# Patient Record
Sex: Female | Born: 1935 | Race: White | Hispanic: No | Smoking: Never smoker
Health system: Southern US, Community
[De-identification: ages and names within clinical notes are randomized; demographics above are authoritative.]

## PROBLEM LIST (undated history)

## (undated) DIAGNOSIS — I219 Acute myocardial infarction, unspecified: Secondary | ICD-10-CM

## (undated) DIAGNOSIS — I639 Cerebral infarction, unspecified: Secondary | ICD-10-CM

## (undated) DIAGNOSIS — K219 Gastro-esophageal reflux disease without esophagitis: Secondary | ICD-10-CM

## (undated) HISTORY — PX: CHOLECYSTECTOMY: SHX55

## (undated) HISTORY — PX: TONSILLECTOMY: SUR1361

## (undated) HISTORY — PX: EYE SURGERY: SHX253

---

## 1998-04-06 ENCOUNTER — Ambulatory Visit (HOSPITAL_COMMUNITY): Admission: RE | Admit: 1998-04-06 | Discharge: 1998-04-06 | Payer: Self-pay | Admitting: Family Medicine

## 1998-04-12 ENCOUNTER — Other Ambulatory Visit: Admission: RE | Admit: 1998-04-12 | Discharge: 1998-04-12 | Payer: Self-pay | Admitting: Family Medicine

## 1998-09-12 ENCOUNTER — Other Ambulatory Visit: Admission: RE | Admit: 1998-09-12 | Discharge: 1998-09-12 | Payer: Self-pay | Admitting: Obstetrics and Gynecology

## 1999-06-22 ENCOUNTER — Other Ambulatory Visit: Admission: RE | Admit: 1999-06-22 | Discharge: 1999-06-22 | Payer: Self-pay | Admitting: Obstetrics and Gynecology

## 1999-07-21 ENCOUNTER — Encounter: Admission: RE | Admit: 1999-07-21 | Discharge: 1999-07-21 | Payer: Self-pay | Admitting: Family Medicine

## 1999-09-07 ENCOUNTER — Other Ambulatory Visit: Admission: RE | Admit: 1999-09-07 | Discharge: 1999-09-07 | Payer: Self-pay | Admitting: Orthopaedic Surgery

## 2000-08-13 ENCOUNTER — Other Ambulatory Visit: Admission: RE | Admit: 2000-08-13 | Discharge: 2000-08-13 | Payer: Self-pay | Admitting: Obstetrics and Gynecology

## 2011-10-09 HISTORY — PX: CORONARY ANGIOPLASTY WITH STENT PLACEMENT: SHX49

## 2012-03-21 ENCOUNTER — Encounter (HOSPITAL_BASED_OUTPATIENT_CLINIC_OR_DEPARTMENT_OTHER): Payer: Self-pay | Admitting: *Deleted

## 2012-03-21 ENCOUNTER — Emergency Department (HOSPITAL_BASED_OUTPATIENT_CLINIC_OR_DEPARTMENT_OTHER)
Admission: EM | Admit: 2012-03-21 | Discharge: 2012-03-21 | Disposition: A | Discharge status: Home / Self Care | Payer: Medicare Other | Attending: Emergency Medicine | Admitting: Emergency Medicine

## 2012-03-21 ENCOUNTER — Emergency Department (HOSPITAL_BASED_OUTPATIENT_CLINIC_OR_DEPARTMENT_OTHER): Payer: Medicare Other

## 2012-03-21 DIAGNOSIS — R079 Chest pain, unspecified: Secondary | ICD-10-CM | POA: Insufficient documentation

## 2012-03-21 DIAGNOSIS — I214 Non-ST elevation (NSTEMI) myocardial infarction: Secondary | ICD-10-CM

## 2012-03-21 DIAGNOSIS — K219 Gastro-esophageal reflux disease without esophagitis: Secondary | ICD-10-CM | POA: Insufficient documentation

## 2012-03-21 DIAGNOSIS — M546 Pain in thoracic spine: Secondary | ICD-10-CM | POA: Insufficient documentation

## 2012-03-21 HISTORY — DX: Gastro-esophageal reflux disease without esophagitis: K21.9

## 2012-03-21 LAB — CBC
HCT: 43.3 % (ref 36.0–46.0)
Hemoglobin: 15.7 g/dL — ABNORMAL HIGH (ref 12.0–15.0)
MCV: 90.2 fL (ref 78.0–100.0)
RDW: 12.3 % (ref 11.5–15.5)
WBC: 11.3 10*3/uL — ABNORMAL HIGH (ref 4.0–10.5)

## 2012-03-21 LAB — COMPREHENSIVE METABOLIC PANEL
AST: 27 U/L (ref 0–37)
BUN: 17 mg/dL (ref 6–23)
CO2: 25 mEq/L (ref 19–32)
Calcium: 9.7 mg/dL (ref 8.4–10.5)
Chloride: 101 mEq/L (ref 96–112)
Creatinine, Ser: 1 mg/dL (ref 0.50–1.10)
GFR calc Af Amer: 63 mL/min — ABNORMAL LOW (ref >90)
GFR calc non Af Amer: 54 mL/min — ABNORMAL LOW (ref >90)
Glucose, Bld: 144 mg/dL — ABNORMAL HIGH (ref 70–99)
Total Bilirubin: 0.3 mg/dL (ref 0.3–1.2)

## 2012-03-21 LAB — CK TOTAL AND CKMB (NOT AT ARMC)
CK, MB: 5.7 ng/mL — ABNORMAL HIGH (ref 0.3–4.0)
Relative Index: INVALID (ref 0.0–2.5)
Total CK: 41 U/L (ref 7–177)

## 2012-03-21 LAB — DIFFERENTIAL
Basophils Absolute: 0 10*3/uL (ref 0.0–0.1)
Eosinophils Relative: 0 % (ref 0–5)
Lymphocytes Relative: 33 % (ref 12–46)
Lymphs Abs: 3.8 10*3/uL (ref 0.7–4.0)
Monocytes Absolute: 0.8 10*3/uL (ref 0.1–1.0)
Monocytes Relative: 7 % (ref 3–12)
Neutro Abs: 6.8 10*3/uL (ref 1.7–7.7)

## 2012-03-21 MED ORDER — HEPARIN (PORCINE) IN NACL 100-0.45 UNIT/ML-% IJ SOLN
12.0000 [IU]/kg/h | Freq: Once | INTRAMUSCULAR | Status: AC
  Administered 2012-03-21: 12 [IU]/kg/h via INTRAVENOUS
  Filled 2012-03-21: qty 250

## 2012-03-21 MED ORDER — HEPARIN SODIUM (PORCINE) 1000 UNIT/ML IJ SOLN
4000.0000 [IU] | Freq: Once | INTRAMUSCULAR | Status: AC
  Administered 2012-03-21: 4000 [IU] via INTRAVENOUS
  Filled 2012-03-21: qty 4

## 2012-03-21 MED ORDER — SODIUM CHLORIDE 0.9 % IV SOLN
Freq: Once | INTRAVENOUS | Status: AC
  Administered 2012-03-21: 20 mL/h via INTRAVENOUS

## 2012-03-21 MED ORDER — ACETAMINOPHEN 325 MG PO TABS
650.0000 mg | ORAL_TABLET | ORAL | Status: AC
  Administered 2012-03-21: 650 mg via ORAL
  Filled 2012-03-21: qty 2

## 2012-03-21 MED ORDER — GI COCKTAIL ~~LOC~~
30.0000 mL | Freq: Once | ORAL | Status: AC
  Administered 2012-03-21: 30 mL via ORAL
  Filled 2012-03-21: qty 30

## 2012-03-21 NOTE — ED Notes (Signed)
Beeped Dr. Reuel Boom direct for Dr. Radford Pax

## 2012-03-21 NOTE — ED Provider Notes (Signed)
I saw and evaluated the patient, reviewed the resident's note and I agree with the findings and plan.   .Face to face Exam:  General:  Awake HEENT:  Atraumatic Resp:  Normal effort Abd:  Nondistended Neuro:No focal weakness Lymph: No adenopathy   CRITICAL CARE Performed by: Nelva Nay L   Total critical care time: 30 min  Critical care time was exclusive of separately billable procedures and treating other patients.  Critical care was necessary to treat or prevent imminent or life-threatening deterioration.  Critical care was time spent personally by me on the following activities: development of treatment plan with patient and/or surrogate as well as nursing, discussions with consultants, evaluation of patient's response to treatment, examination of patient, obtaining history from patient or surrogate, ordering and performing treatments and interventions, ordering and review of laboratory studies, ordering and review of radiographic studies, pulse oximetry and re-evaluation of patient's condition.   Nelia Shi, MD 03/21/12 [phone]

## 2012-03-21 NOTE — ED Notes (Signed)
Called for disk  for tranfer.

## 2012-03-21 NOTE — ED Notes (Signed)
Pt c/o upper back pain and fullness in chest x 2 days, denies SOB

## 2012-03-21 NOTE — ED Provider Notes (Signed)
History     CSN: [id number]  Arrival date & time 03/21/12  1431   First MD Initiated Contact with Patient 03/21/12 1504      Chief Complaint  Patient presents with  . Chest Pain    (Consider location/radiation/quality/duration/timing/severity/associated sxs/prior treatment) HPI Comments: 76 yo female with a history of GERD presents with pain between the shoulder blades and indigestion since last evening. She did some yard work with hedge clippers-holding arms extended-yesterday afternoon. Later that evening while she was in the house, she noted some sharp pain in thoracic back radiating from right to left shoulder blade. She took some aspirin and it improved, then ate dinner and the pain worsened. Took 2 more baby aspirin and it improved, but notes some indigestion, fullness in her epigastrium and belching. This improved before bedtime and she slept well. Then after breakfast this morning her symptoms returned again, then resolved. Again after lunch today they returned and seemed to worsen. She took 4 baby aspirin again. On her drive over to the ED, while holding her arms outstretched on the steering wheel she felt like her back "locked up" and couldn't move. Now the pain is minimal. Does not seem to be related to exertion.   Denies any dyspnea, cough, fever, falls, trauma, chest pain, palpitations, emesis, nausea, diaphoresis, leg edema.  Patient is a 76 y.o. female presenting with chest pain.  Chest Pain Pertinent negatives for primary symptoms include no fever, no shortness of breath, no cough and no palpitations.     Past Medical History  Diagnosis Date  . GERD (gastroesophageal reflux disease)     Past Surgical History  Procedure Date  . Cholecystectomy   . Tonsillectomy     History reviewed. No pertinent family history.  History  Substance Use Topics  . Smoking status: Never Smoker   . Smokeless tobacco: Not on file  . Alcohol Use: No    OB History    Grav Para  Term Preterm Abortions TAB SAB Ect Mult Living                  Review of Systems  Constitutional: Negative for fever and appetite change.  HENT: Negative for neck pain.   Respiratory: Negative for cough and shortness of breath.   Cardiovascular: Positive for chest pain. Negative for palpitations.  Genitourinary: Negative for dysuria.  Musculoskeletal: Positive for back pain.  Neurological: Negative for syncope and light-headedness.  All other systems reviewed and are negative.    Allergies  Amoxil  Home Medications   Current Outpatient Rx  Name Route Sig Dispense Refill  . OMEPRAZOLE 40 MG PO CPDR Oral Take 40 mg by mouth daily.      BP 146/98  Pulse 90  Temp 97.9 F (36.6 C) (Oral)  Resp 16  Ht 5\' 6"  (1.676 m)  Wt 200 lb (90.719 kg)  BMI 32.28 kg/m2  SpO2 100%  Physical Exam  Vitals reviewed. Constitutional: She is oriented to person, place, and time. She appears well-developed and well-nourished. No distress.  HENT:  Head: Normocephalic and atraumatic.  Mouth/Throat: Oropharynx is clear and moist.  Eyes: EOM are normal. Pupils are equal, round, and reactive to light.  Neck: Normal range of motion. Neck supple.  Cardiovascular: Normal rate, regular rhythm, normal heart sounds and intact distal pulses.  Exam reveals no gallop.   No murmur heard. Pulmonary/Chest: Effort normal and breath sounds normal. No respiratory distress. She has no wheezes. She has no rales. She exhibits no tenderness.  Abdominal: Soft. Bowel sounds are normal. She exhibits no distension. There is no tenderness. There is no rebound and no guarding.  Musculoskeletal: She exhibits no edema.       TTP in right thoracic paraspinal musculature.   Neurological: She is alert and oriented to person, place, and time. She exhibits normal muscle tone. Coordination normal.  Skin: No rash noted. She is not diaphoretic.  Psychiatric: She has a normal mood and affect.    Date: 03/21/2012  Rate: 95   Rhythm: normal sinus rhythm  QRS Axis: normal  Intervals: normal  ST/T Wave abnormalities: normal  Conduction Disutrbances:none  Narrative Interpretation:   Old EKG Reviewed: none available; QW in V2    ED Course  Procedures (including critical care time)  Labs Reviewed  CBC - Abnormal; Notable for the following:    WBC 11.3 (*)     Hemoglobin 15.7 (*)     MCHC 36.3 (*)     All other components within normal limits  COMPREHENSIVE METABOLIC PANEL - Abnormal; Notable for the following:    Potassium 3.4 (*)     Glucose, Bld 144 (*)     GFR calc non Af Amer 54 (*)     GFR calc Af Amer 63 (*)     All other components within normal limits  CK TOTAL AND CKMB - Abnormal; Notable for the following:    CK, MB 5.7 (*)     All other components within normal limits  TROPONIN I - Abnormal; Notable for the following:    Troponin I 1.11 (*)     All other components within normal limits  DIFFERENTIAL   Lab Results  Component Value Date   CREATININE 1.00 03/21/2012   Lab Results  Component Value Date   NA 137 03/21/2012   K 3.4* 03/21/2012   CL 101 03/21/2012   CO2 25 03/21/2012     Dg Chest 2 View  03/21/2012  *RADIOLOGY REPORT*  Clinical Data: Chest pain, pain between shoulder blades  CHEST - 2 VIEW  Comparison: None  Findings: Borderline enlargement of cardiac silhouette. Tortuous aorta. Pulmonary vascularity normal. Lungs clear. No pleural effusion or pneumothorax. No acute osseous findings.  IMPRESSION: Borderline enlargement of cardiac silhouette.  Original Report Authenticated By: Lollie Marrow, M.D.     1. NSTEMI (non-ST elevated myocardial infarction)      MDM  76 yo female with history of GERD presents with back pain and indigestion. No obvious EKG changes, + troponin 1.11, c/w NSTEMI. No active pain. Will start heparin drip and admit to [facility], case d/w Cardiology, Dr. Reuel Boom.       Durwin Reges, MD 03/21/12 [phone]

## 2017-01-13 ENCOUNTER — Emergency Department (HOSPITAL_BASED_OUTPATIENT_CLINIC_OR_DEPARTMENT_OTHER)
Admission: EM | Admit: 2017-01-13 | Discharge: 2017-01-13 | Disposition: A | Discharge status: Home / Self Care | Payer: Medicare Other | Attending: Emergency Medicine | Admitting: Emergency Medicine

## 2017-01-13 ENCOUNTER — Emergency Department (HOSPITAL_BASED_OUTPATIENT_CLINIC_OR_DEPARTMENT_OTHER): Payer: Medicare Other

## 2017-01-13 ENCOUNTER — Encounter (HOSPITAL_BASED_OUTPATIENT_CLINIC_OR_DEPARTMENT_OTHER): Payer: Self-pay | Admitting: Emergency Medicine

## 2017-01-13 DIAGNOSIS — Z7982 Long term (current) use of aspirin: Secondary | ICD-10-CM | POA: Insufficient documentation

## 2017-01-13 DIAGNOSIS — M546 Pain in thoracic spine: Secondary | ICD-10-CM | POA: Insufficient documentation

## 2017-01-13 DIAGNOSIS — Z955 Presence of coronary angioplasty implant and graft: Secondary | ICD-10-CM | POA: Diagnosis not present

## 2017-01-13 DIAGNOSIS — Z79899 Other long term (current) drug therapy: Secondary | ICD-10-CM | POA: Diagnosis not present

## 2017-01-13 HISTORY — DX: Acute myocardial infarction, unspecified: I21.9

## 2017-01-13 LAB — CBC WITH DIFFERENTIAL/PLATELET
Basophils Absolute: 0 10*3/uL (ref 0.0–0.1)
Basophils Relative: 0 %
Eosinophils Absolute: 0 10*3/uL (ref 0.0–0.7)
Eosinophils Relative: 0 %
HCT: 36.7 % (ref 36.0–46.0)
Hemoglobin: 12.8 g/dL (ref 12.0–15.0)
Lymphocytes Relative: 26 %
Lymphs Abs: 2.2 10*3/uL (ref 0.7–4.0)
MCH: 31.9 pg (ref 26.0–34.0)
MCHC: 34.9 g/dL (ref 30.0–36.0)
MCV: 91.5 fL (ref 78.0–100.0)
Monocytes Absolute: 0.8 10*3/uL (ref 0.1–1.0)
Monocytes Relative: 9 %
Neutro Abs: 5.4 10*3/uL (ref 1.7–7.7)
Neutrophils Relative %: 65 %
Platelets: 216 10*3/uL (ref 150–400)
RBC: 4.01 MIL/uL (ref 3.87–5.11)
RDW: 12.1 % (ref 11.5–15.5)
WBC: 8.3 10*3/uL (ref 4.0–10.5)

## 2017-01-13 LAB — BASIC METABOLIC PANEL
Anion gap: 8 (ref 5–15)
BUN: 21 mg/dL — ABNORMAL HIGH (ref 6–20)
CO2: 25 mmol/L (ref 22–32)
Calcium: 9.2 mg/dL (ref 8.9–10.3)
Chloride: 104 mmol/L (ref 101–111)
Creatinine, Ser: 1.02 mg/dL — ABNORMAL HIGH (ref 0.44–1.00)
GFR calc Af Amer: 59 mL/min — ABNORMAL LOW (ref >60)
GFR calc non Af Amer: 51 mL/min — ABNORMAL LOW (ref >60)
Glucose, Bld: 107 mg/dL — ABNORMAL HIGH (ref 65–99)
Potassium: 3.8 mmol/L (ref 3.5–5.1)
Sodium: 137 mmol/L (ref 135–145)

## 2017-01-13 LAB — TROPONIN I: Troponin I: <0.03 ng/mL (ref <0.03)

## 2017-01-13 NOTE — ED Triage Notes (Signed)
Pt with back pain in the thoracic area since Friday. Denies radiation or recent injury. NAD.   Pt does have hx of MI 5 years ago. EKG to be done in the room.

## 2017-01-13 NOTE — ED Provider Notes (Signed)
[facility] Provider Note   CSN: [id number] Arrival date & time: 01/13/17  1329    By signing my name below, I, Freida Busman, attest that this documentation has been prepared under the direction and in the presence of Raeford Razor, MD . Electronically Signed: Freida Busman, Scribe. 01/13/2017. 2:02 PM. History   Chief Complaint Chief Complaint  Patient presents with  . Back Pain    The history is provided by the patient. No language interpreter was used.    HPI Comments:  Patricia Case is a 81 y.o. female who presents to the Emergency Department complaining of intermittent mid back pain x 3 days. She describes her pain as a dull ache. Pt has a h/o MI in June 2013 requiring coronary stents; notes back pain at that time but states her pain was sharp. She has applied a heat pain with mild relief. Pt has also taken baby ASA.  She denies exacerbation of pain with movement. No SOB, DOE, nausea, or diaphoresis.   [facility]   Past Medical History:  Diagnosis Date  . Acute MI    HX of 2 MI's last one 2013  . GERD (gastroesophageal reflux disease)     There are no active problems to display for this patient.   Past Surgical History:  Procedure Laterality Date  . CHOLECYSTECTOMY    . CORONARY ANGIOPLASTY WITH STENT PLACEMENT  2013  . EYE SURGERY    . TONSILLECTOMY      OB History    No data available       Home Medications    Prior to Admission medications   Medication Sig Start Date End Date Taking? Authorizing Provider  aspirin EC 81 MG tablet Take 81 mg by mouth daily.   Yes Historical Provider, MD  Calcium Carb-Cholecalciferol (CALCIUM PLUS VITAMIN D3 PO) Take 1 tablet by mouth daily.   Yes Historical Provider, MD  Calcium Carbonate-Vit D-Min (CALTRATE 600+D PLUS PO) Take by mouth.   Yes Historical Provider, MD  fenofibrate micronized (LOFIBRA) 67 MG capsule Take 67 mg by mouth daily before breakfast.   Yes Historical  Provider, MD  Boris Lown Oil 500 MG CAPS Take 500 mg by mouth.   Yes Historical Provider, MD  losartan (COZAAR) 25 MG tablet Take 25 mg by mouth daily.   Yes Historical Provider, MD  metoprolol succinate (TOPROL-XL) 50 MG 24 hr tablet Take 50 mg by mouth daily. Take with or immediately following a meal.   Yes Historical Provider, MD  omeprazole (PRILOSEC) 40 MG capsule Take 40 mg by mouth daily. Patient is using this medication for acid reflux.   Yes Historical Provider, MD  pravastatin (PRAVACHOL) 40 MG tablet Take 40 mg by mouth daily.   Yes Historical Provider, MD  Homeopathic Products (CLEAR TINNITUS PO) Take 4 capsules by mouth daily. Patient is using this medication tinnitus.    Historical Provider, MD  nitroGLYCERIN (NITROSTAT) 0.4 MG SL tablet Place 0.4 mg under the tongue every 5 (five) minutes as needed for chest pain.    Historical Provider, MD    Family History No family history on file.  Social History Social History  Substance Use Topics  . Smoking status: Never Smoker  . Smokeless tobacco: Never Used  . Alcohol use No     Allergies   Penicillins and Amoxil [amoxicillin]   Review of Systems Review of Systems  Constitutional: Negative for diaphoresis.  Respiratory: Negative for shortness of breath.   Gastrointestinal: Negative  for nausea.  Musculoskeletal: Positive for back pain.  All other systems reviewed and are negative.  Physical Exam Updated Vital Signs BP (!) 136/91 (BP Location: Left Arm)   Pulse 70   Temp 98.1 F (36.7 C) (Oral)   Resp 17   Ht  (1.676 m)   Wt 200 lb (90.7 kg)   SpO2 99%   BMI 32.28 kg/m   Physical Exam  Constitutional: She is oriented to person, place, and time. She appears well-developed and well-nourished. No distress.  HENT:  Head: Normocephalic and atraumatic.  Eyes: EOM are normal.  Neck: Normal range of motion.  Cardiovascular: Normal rate, regular rhythm and normal heart sounds.   Pulmonary/Chest: Effort normal and  breath sounds normal.  Abdominal: Soft. She exhibits no distension. There is no tenderness.  Musculoskeletal: Normal range of motion.  Back pain is not reproducible   Neurological: She is alert and oriented to person, place, and time.  Skin: Skin is warm and dry.  Psychiatric: She has a normal mood and affect. Judgment normal.  Nursing note and vitals reviewed.    ED Treatments / Results  DIAGNOSTIC STUDIES:  Oxygen Saturation is 99% on RA, normal by my interpretation.    COORDINATION OF CARE:  2:01 PM Discussed treatment plan with pt at bedside and pt agreed to plan.  Labs (all labs ordered are listed, but only abnormal results are displayed) Labs Reviewed  CBC WITH DIFFERENTIAL/PLATELET  BASIC METABOLIC PANEL  TROPONIN I    EKG  EKG Interpretation  Date/Time:  yF with back pain. Musculoskeletal? Seems very atypical for ACS although she reports back pain with prior MI. This pain feels different though and has been constant since onset Friday. No trauma. Nonfocal neuro exam. She is declining pain meds. CXR w/o acute pathology. EKG w/o overt ischemic changes. Will check a troponin. With symptoms going on since Friday, I think she can appropriately be discharged if normal.   Final Clinical Impressions(s) / ED Diagnoses   Final diagnoses:  Acute thoracic back pain, unspecified back pain laterality    New Prescriptions New Prescriptions   No medications on file   I personally preformed the services scribed in my presence. The recorded information has been  reviewed is accurate. Raeford Razor, MD.     Raeford Razor, MD 01/21/17 [phone]

## 2018-07-26 ENCOUNTER — Encounter (HOSPITAL_BASED_OUTPATIENT_CLINIC_OR_DEPARTMENT_OTHER): Payer: Self-pay | Admitting: Adult Health

## 2018-07-26 ENCOUNTER — Other Ambulatory Visit: Payer: Self-pay

## 2018-07-26 ENCOUNTER — Emergency Department (HOSPITAL_BASED_OUTPATIENT_CLINIC_OR_DEPARTMENT_OTHER)
Admission: EM | Admit: 2018-07-26 | Discharge: 2018-07-26 | Disposition: A | Discharge status: Home / Self Care | Payer: Medicare Other | Attending: Emergency Medicine | Admitting: Emergency Medicine

## 2018-07-26 DIAGNOSIS — I252 Old myocardial infarction: Secondary | ICD-10-CM | POA: Insufficient documentation

## 2018-07-26 DIAGNOSIS — Z7982 Long term (current) use of aspirin: Secondary | ICD-10-CM | POA: Insufficient documentation

## 2018-07-26 DIAGNOSIS — Z7902 Long term (current) use of antithrombotics/antiplatelets: Secondary | ICD-10-CM | POA: Insufficient documentation

## 2018-07-26 DIAGNOSIS — Z955 Presence of coronary angioplasty implant and graft: Secondary | ICD-10-CM | POA: Insufficient documentation

## 2018-07-26 DIAGNOSIS — Z79899 Other long term (current) drug therapy: Secondary | ICD-10-CM | POA: Diagnosis not present

## 2018-07-26 DIAGNOSIS — R319 Hematuria, unspecified: Secondary | ICD-10-CM | POA: Insufficient documentation

## 2018-07-26 LAB — CBC WITH DIFFERENTIAL/PLATELET
ABS IMMATURE GRANULOCYTES: 0.05 10*3/uL (ref 0.00–0.07)
BASOS ABS: 0.1 10*3/uL (ref 0.0–0.1)
Basophils Relative: 1 %
Eosinophils Absolute: 0.1 10*3/uL (ref 0.0–0.5)
Eosinophils Relative: 1 %
HEMATOCRIT: 39.6 % (ref 36.0–46.0)
Hemoglobin: 13 g/dL (ref 12.0–15.0)
IMMATURE GRANULOCYTES: 1 %
LYMPHS ABS: 2.4 10*3/uL (ref 0.7–4.0)
LYMPHS PCT: 23 %
MCH: 30.2 pg (ref 26.0–34.0)
MCHC: 32.8 g/dL (ref 30.0–36.0)
MCV: 91.9 fL (ref 80.0–100.0)
Monocytes Absolute: 0.9 10*3/uL (ref 0.1–1.0)
Monocytes Relative: 9 %
NEUTROS ABS: 6.9 10*3/uL (ref 1.7–7.7)
NEUTROS PCT: 65 %
NRBC: 0 % (ref 0.0–0.2)
Platelets: 197 10*3/uL (ref 150–400)
RBC: 4.31 MIL/uL (ref 3.87–5.11)
RDW: 12.1 % (ref 11.5–15.5)
WBC: 10.5 10*3/uL (ref 4.0–10.5)

## 2018-07-26 LAB — URINALYSIS, ROUTINE W REFLEX MICROSCOPIC
BILIRUBIN URINE: NEGATIVE
Glucose, UA: NEGATIVE mg/dL
KETONES UR: NEGATIVE mg/dL
Nitrite: NEGATIVE
PROTEIN: NEGATIVE mg/dL
Specific Gravity, Urine: 1.02 (ref 1.005–1.030)
pH: 5.5 (ref 5.0–8.0)

## 2018-07-26 LAB — URINALYSIS, MICROSCOPIC (REFLEX)

## 2018-07-26 LAB — BASIC METABOLIC PANEL
ANION GAP: 9 (ref 5–15)
BUN: 21 mg/dL (ref 8–23)
CHLORIDE: 103 mmol/L (ref 98–111)
CO2: 25 mmol/L (ref 22–32)
CREATININE: 1.24 mg/dL — AB (ref 0.44–1.00)
Calcium: 9.2 mg/dL (ref 8.9–10.3)
GFR calc Af Amer: 46 mL/min — ABNORMAL LOW (ref >60)
GFR calc non Af Amer: 40 mL/min — ABNORMAL LOW (ref >60)
Glucose, Bld: 116 mg/dL — ABNORMAL HIGH (ref 70–99)
POTASSIUM: 4 mmol/L (ref 3.5–5.1)
Sodium: 137 mmol/L (ref 135–145)

## 2018-07-26 MED ORDER — CEPHALEXIN 500 MG PO CAPS: 500.0000 mg | ORAL_CAPSULE | Freq: Two times a day (BID) | ORAL | 0 refills | Status: AC

## 2018-07-26 NOTE — ED Triage Notes (Signed)
Presents with incontinence twice last night and pink tinge to urine. She denies Hx of incontinence. Denies fevers. Endorses sharp pains in abdomen at times.. Denies dysuria

## 2018-07-26 NOTE — ED Notes (Signed)
ED Provider at bedside. Dr. Linwood Dibbles

## 2018-07-26 NOTE — ED Provider Notes (Signed)
[facility] EMERGENCY DEPARTMENT Provider Note   CSN: [id number] Arrival date & time: 07/26/18  1407     History   Chief Complaint Chief Complaint  Patient presents with  . Hematuria    HPI Patricia Case is a 82 y.o. female who presents with hematuria. PMH significant for hx of MI, GERD. She states that she noticed pink tinged urine today when she went to the bathroom and had blood when she wiped. She also had urinary incontinence last night which she has never had before. She is unsure if it is from her urine vs vaginal. She reports "twinges" of abdominal pain throughout her abdomen but nothing constant or severe. No fever, N/V, dysuria, constipation/diarrhea. She also has been recovering from a virus for the pat 5 weeks. She has had a bad cough which has been slowly resolving. She states that she is very fatigued from this but feels this is overall improving. Past surgical hx significant for cholecystectomy. She takes Aspirin  HPI  Past Medical History:  Diagnosis Date  . Acute MI (HCC)    HX of 2 MI's last one 2013  . GERD (gastroesophageal reflux disease)     There are no active problems to display for this patient.   Past Surgical History:  Procedure Laterality Date  . CHOLECYSTECTOMY    . CORONARY ANGIOPLASTY WITH STENT PLACEMENT  2013  . EYE SURGERY    . TONSILLECTOMY       OB History   None      Home Medications    Prior to Admission medications   Medication Sig Start Date End Date Taking? Authorizing Provider  aspirin EC 81 MG tablet Take 81 mg by mouth daily.    [provider]  Calcium Carb-Cholecalciferol (CALCIUM PLUS VITAMIN D3 PO) Take 1 tablet by mouth daily.    [provider]  Calcium Carbonate-Vit D-Min (CALTRATE 600+D PLUS PO) Take by mouth.    [provider]  fenofibrate micronized (LOFIBRA) 67 MG capsule Take 67 mg by mouth daily before breakfast.    [provider]  Homeopathic Products  (CLEAR TINNITUS PO) Take 4 capsules by mouth daily. Patient is using this medication tinnitus.    [provider]  Boris Lown Oil 500 MG CAPS Take 500 mg by mouth.    [provider]  losartan (COZAAR) 25 MG tablet Take 25 mg by mouth daily.    [provider]  metoprolol succinate (TOPROL-XL) 50 MG 24 hr tablet Take 50 mg by mouth daily. Take with or immediately following a meal.    [provider]  nitroGLYCERIN (NITROSTAT) 0.4 MG SL tablet Place 0.4 mg under the tongue every 5 (five) minutes as needed for chest pain.    [provider]  omeprazole (PRILOSEC) 40 MG capsule Take 40 mg by mouth daily. Patient is using this medication for acid reflux.    [provider]  pravastatin (PRAVACHOL) 40 MG tablet Take 40 mg by mouth daily.    [provider]    Family History History reviewed. No pertinent family history.  Social History Social History   Tobacco Use  . Smoking status: Never Smoker  . Smokeless tobacco: Never Used  Substance Use Topics  . Alcohol use: No  . Drug use: No     Allergies   Penicillins and Amoxil [amoxicillin]   Review of Systems Review of Systems  Constitutional: Negative for chills and fever.  Gastrointestinal: Positive for abdominal pain. Negative for diarrhea,  nausea and vomiting.  Genitourinary: Positive for enuresis and hematuria. Negative for dysuria, flank pain, frequency and pelvic pain.  All other systems reviewed and are negative.    Physical Exam Updated Vital Signs BP (!) 155/85 (BP Location: Left Arm)   Pulse 73   Temp 98.7 F (37.1 C) (Oral)   Resp 18   SpO2 100%   Physical Exam  Constitutional: She is oriented to person, place, and time. She appears well-developed and well-nourished. No distress.  Calm, cooperative, well appearing  HENT:  Head: Normocephalic and atraumatic.  Eyes: Pupils are equal, round, and reactive to light. Conjunctivae are normal. Right eye exhibits  no discharge. Left eye exhibits no discharge. No scleral icterus.  Neck: Normal range of motion.  Cardiovascular: Normal rate and regular rhythm.  Pulmonary/Chest: Effort normal and breath sounds normal. No respiratory distress.  Abdominal: Soft. Bowel sounds are normal. She exhibits no distension. There is no tenderness.  Neurological: She is alert and oriented to person, place, and time.  Skin: Skin is warm and dry.  Psychiatric: She has a normal mood and affect. Her behavior is normal.  Nursing note and vitals reviewed.    ED Treatments / Results  Labs (all labs ordered are listed, but only abnormal results are displayed) Labs Reviewed  URINALYSIS, ROUTINE W REFLEX MICROSCOPIC - Abnormal; Notable for the following components:      Result Value   Hgb urine dipstick LARGE (*)    Leukocytes, UA MODERATE (*)    All other components within normal limits  URINALYSIS, MICROSCOPIC (REFLEX) - Abnormal; Notable for the following components:   Bacteria, UA FEW (*)    Non Squamous Epithelial PRESENT (*)    All other components within normal limits  BASIC METABOLIC PANEL - Abnormal; Notable for the following components:   Glucose, Bld 116 (*)    Creatinine, Ser 1.24 (*)    GFR calc non Af Amer 40 (*)    GFR calc Af Amer 46 (*)    All other components within normal limits  URINE CULTURE  CBC WITH DIFFERENTIAL/PLATELET    EKG None  Radiology No results found.  Procedures Procedures (including critical care time)  Medications Ordered in ED Medications - No data to display   Initial Impression / Assessment and Plan / ED Course  I have reviewed the triage vital signs and the nursing notes.  Pertinent labs & imaging results that were available during my care of the patient were reviewed by me and considered in my medical decision making (see chart for details).  82 year old female presents with painless hematuria and enuresis since last night/this morning. She is mildly  hypertensive but otherwise vitals are normal. She is well-appearing. Abdomen is soft, non-tender. There is no visible blood coming from vaginal area - likely urinary source. UA shows large hgb, moderate leukocytes, few bacteria, 0-5 WBC. Unclear if this represents infection or not. Will sent for culture. Discussed with Dr. Lynelle Doctor - will obtain CBC, BMP.  CBC is normal. BMP shows CKD. Will start on Keflex. She was given instructions to f/u with PCP if not improving and to return here if worsening.  Final Clinical Impressions(s) / ED Diagnoses   Final diagnoses:  Hematuria, unspecified type    ED Discharge Orders    None       Bethel Born, PA-C 07/26/18 1651    Linwood Dibbles, MD 07/27/18 [phone]

## 2018-07-26 NOTE — Discharge Instructions (Signed)
Please started Keflex twice daily for 5 days Follow up with your doctor if your symptoms are not improving Return if you are worsening (fever, severe abdominal pain, vomiting)

## 2018-07-26 NOTE — ED Notes (Signed)
ED Provider at bedside. 

## 2018-07-28 LAB — URINE CULTURE: CULTURE: NO GROWTH

## 2019-10-19 ENCOUNTER — Ambulatory Visit: Payer: Medicare Other | Attending: Internal Medicine

## 2019-11-07 ENCOUNTER — Ambulatory Visit: Payer: Medicare Other

## 2019-11-12 ENCOUNTER — Ambulatory Visit: Payer: Medicare Other

## 2019-11-12 ENCOUNTER — Ambulatory Visit: Payer: Medicare Other | Attending: Internal Medicine

## 2019-11-12 DIAGNOSIS — Z23 Encounter for immunization: Secondary | ICD-10-CM | POA: Insufficient documentation

## 2019-11-12 NOTE — Progress Notes (Signed)
   Covid-19 Vaccination Clinic  Name:  Patricia Case    MRN: [id number] DOB: Apr 10, 1936  11/12/2019  Patricia Case was observed post Covid-19 immunization for 15 minutes without incidence. She was provided with Vaccine Information Sheet and instruction to access the V-Safe system.   Patricia Case was instructed to call 911 with any severe reactions post vaccine: Marland Kitchen Difficulty breathing  . Swelling of your face and throat  . A fast heartbeat  . A bad rash all over your body  . Dizziness and weakness    Immunizations Administered    Name Date Dose VIS Date Route   Pfizer COVID-19 Vaccine 11/12/2019 11:53 AM 0.3 mL 09/18/2019 Intramuscular   Manufacturer: [employer], [employer]   Lot: [id number]   NDC: [address]-2

## 2019-12-08 ENCOUNTER — Ambulatory Visit: Payer: Medicare Other | Attending: Internal Medicine

## 2019-12-08 DIAGNOSIS — Z23 Encounter for immunization: Secondary | ICD-10-CM

## 2019-12-08 NOTE — Progress Notes (Signed)
   Covid-19 Vaccination Clinic  Name:  Patricia Case    MRN: [id number] DOB: [birth year]  12/08/2019  Patricia Case was observed post Covid-19 immunization for 15 minutes without incident. She was provided with Vaccine Information Sheet and instruction to access the V-Safe system.   Patricia Case was instructed to call 911 with any severe reactions post vaccine: Marland Kitchen Difficulty breathing  . Swelling of face and throat  . A fast heartbeat  . A bad rash all over body  . Dizziness and weakness   Immunizations Administered    Name Date Dose VIS Date Route   Pfizer COVID-19 Vaccine 12/08/2019 10:56 AM 0.3 mL 09/18/2019 Intramuscular   Manufacturer: [employer], [employer]   Lot: [id number]   NDC: [address]-0

## 2021-05-25 ENCOUNTER — Encounter (HOSPITAL_BASED_OUTPATIENT_CLINIC_OR_DEPARTMENT_OTHER): Payer: Self-pay | Admitting: *Deleted

## 2021-05-25 ENCOUNTER — Emergency Department (HOSPITAL_BASED_OUTPATIENT_CLINIC_OR_DEPARTMENT_OTHER): Payer: Medicare Other

## 2021-05-25 ENCOUNTER — Emergency Department (HOSPITAL_BASED_OUTPATIENT_CLINIC_OR_DEPARTMENT_OTHER)
Admission: EM | Admit: 2021-05-25 | Discharge: 2021-05-25 | Disposition: A | Discharge status: Home / Self Care | Payer: Medicare Other | Attending: Emergency Medicine | Admitting: Emergency Medicine

## 2021-05-25 ENCOUNTER — Other Ambulatory Visit: Payer: Self-pay

## 2021-05-25 ENCOUNTER — Emergency Department (HOSPITAL_COMMUNITY): Payer: Medicare Other

## 2021-05-25 DIAGNOSIS — R531 Weakness: Secondary | ICD-10-CM | POA: Diagnosis present

## 2021-05-25 DIAGNOSIS — Z7982 Long term (current) use of aspirin: Secondary | ICD-10-CM | POA: Diagnosis not present

## 2021-05-25 DIAGNOSIS — R29898 Other symptoms and signs involving the musculoskeletal system: Secondary | ICD-10-CM

## 2021-05-25 DIAGNOSIS — Z7901 Long term (current) use of anticoagulants: Secondary | ICD-10-CM | POA: Insufficient documentation

## 2021-05-25 DIAGNOSIS — G459 Transient cerebral ischemic attack, unspecified: Secondary | ICD-10-CM

## 2021-05-25 DIAGNOSIS — I700 Atherosclerosis of aorta: Secondary | ICD-10-CM | POA: Insufficient documentation

## 2021-05-25 DIAGNOSIS — Z880 Allergy status to penicillin: Secondary | ICD-10-CM | POA: Insufficient documentation

## 2021-05-25 DIAGNOSIS — Z79899 Other long term (current) drug therapy: Secondary | ICD-10-CM | POA: Insufficient documentation

## 2021-05-25 HISTORY — DX: Cerebral infarction, unspecified: I63.9

## 2021-05-25 LAB — BASIC METABOLIC PANEL
Anion gap: 8 (ref 5–15)
BUN: 26 mg/dL — ABNORMAL HIGH (ref 8–23)
CO2: 23 mmol/L (ref 22–32)
Calcium: 9 mg/dL (ref 8.9–10.3)
Chloride: 102 mmol/L (ref 98–111)
Creatinine, Ser: 1.39 mg/dL — ABNORMAL HIGH (ref 0.44–1.00)
GFR, Estimated: 38 mL/min — ABNORMAL LOW (ref >60)
Glucose, Bld: 140 mg/dL — ABNORMAL HIGH (ref 70–99)
Potassium: 3.6 mmol/L (ref 3.5–5.1)
Sodium: 133 mmol/L — ABNORMAL LOW (ref 135–145)

## 2021-05-25 LAB — CBC WITH DIFFERENTIAL/PLATELET
Abs Immature Granulocytes: 0.04 10*3/uL (ref 0.00–0.07)
Basophils Absolute: 0.1 10*3/uL (ref 0.0–0.1)
Basophils Relative: 1 %
Eosinophils Absolute: 0 10*3/uL (ref 0.0–0.5)
Eosinophils Relative: 0 %
HCT: 38.1 % (ref 36.0–46.0)
Hemoglobin: 13.4 g/dL (ref 12.0–15.0)
Immature Granulocytes: 1 %
Lymphocytes Relative: 28 %
Lymphs Abs: 2.5 10*3/uL (ref 0.7–4.0)
MCH: 31.5 pg (ref 26.0–34.0)
MCHC: 35.2 g/dL (ref 30.0–36.0)
MCV: 89.4 fL (ref 80.0–100.0)
Monocytes Absolute: 0.7 10*3/uL (ref 0.1–1.0)
Monocytes Relative: 8 %
Neutro Abs: 5.6 10*3/uL (ref 1.7–7.7)
Neutrophils Relative %: 62 %
Platelets: 217 10*3/uL (ref 150–400)
RBC: 4.26 MIL/uL (ref 3.87–5.11)
RDW: 12.6 % (ref 11.5–15.5)
WBC: 8.8 10*3/uL (ref 4.0–10.5)
nRBC: 0 % (ref 0.0–0.2)

## 2021-05-25 LAB — LIPID PANEL
Cholesterol: 112 mg/dL (ref 0–200)
HDL: 34 mg/dL — ABNORMAL LOW (ref >40)
LDL Cholesterol: 33 mg/dL (ref 0–99)
Total CHOL/HDL Ratio: 3.3 RATIO
Triglycerides: 227 mg/dL — ABNORMAL HIGH (ref <150)
VLDL: 45 mg/dL — ABNORMAL HIGH (ref 0–40)

## 2021-05-25 MED ORDER — IOHEXOL 350 MG/ML SOLN
60.0000 mL | Freq: Once | INTRAVENOUS | Status: AC | PRN
  Administered 2021-05-25: 60 mL via INTRAVENOUS

## 2021-05-25 NOTE — Progress Notes (Signed)
Brief Neuro Note:  Briefly, Ms. Patricia Case is a 85 y.o. 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  She is on Eliquis 5mg  BID and is compliant. CT Head at [facility] with no acute ICH, no large territory infarct.  I discussed this case with Dr. [facility] with the ED team. This patient is already on maximal medical therapy with Anticoagulation from a stroke standpoint.  I think it is reasonable to get Vessel imaging with CT angio head and neck given I may consider adding an antiplatelet agent for advanced atherosclerotic disease, but otherwise, I do not think that getting the full stroke workup is going to result in change in management.  If the vessel imaging is negative for significant stenosis or occlusion, I am okay with discharge home and close follow up with outpatient neurologist and strict return precautions. She had recent LDL < 70.   Seth Bake [facility] Pager Number Erick Blinks

## 2021-05-25 NOTE — ED Provider Notes (Addendum)
[facility] EMERGENCY DEPARTMENT Provider Note   CSN: [id number] Arrival date & time: 05/25/21  1939     History Chief Complaint  Patient presents with   Neurologic Problem     Patricia Case is a 85 y.o. female.  History of strokes on Eliquis.  Had left arm weakness for about 10 to 15 minutes prior to arrival but now fully resolved and back at her baseline.  The history is provided by the patient.  Neurologic Problem This is a new problem. The current episode started less than 1 hour ago. The problem has been resolved. Pertinent negatives include no chest pain, no abdominal pain, no headaches and no shortness of breath. Nothing aggravates the symptoms. Nothing relieves the symptoms. She has tried nothing for the symptoms. The treatment provided no relief.      Past Medical History:  Diagnosis Date   Acute MI (HCC)    HX of 2 MI's last one 2013   GERD (gastroesophageal reflux disease)    Stroke (HCC)     There are no problems to display for this patient.   Past Surgical History:  Procedure Laterality Date   CHOLECYSTECTOMY     CORONARY ANGIOPLASTY WITH STENT PLACEMENT  2013   EYE SURGERY     TONSILLECTOMY       OB History   No obstetric history on file.     No family history on file.  Social History   Tobacco Use   Smoking status: Never   Smokeless tobacco: Never  Vaping Use   Vaping Use: Never used  Substance Use Topics   Alcohol use: No   Drug use: No    Home Medications Prior to Admission medications   Medication Sig Start Date End Date Taking? Authorizing Provider  aspirin EC 81 MG tablet Take 81 mg by mouth daily.    [provider]  Calcium Carb-Cholecalciferol (CALCIUM PLUS VITAMIN D3 PO) Take 1 tablet by mouth daily.    [provider]  Calcium Carbonate-Vit D-Min (CALTRATE 600+D PLUS PO) Take by mouth.    [provider]  cephALEXin (KEFLEX) 500 MG capsule Take 1 capsule (500 mg total) by mouth 2  (two) times daily. 07/26/18   Bethel Born, PA-C  fenofibrate micronized (LOFIBRA) 67 MG capsule Take 67 mg by mouth daily before breakfast.    [provider]  Homeopathic Products (CLEAR TINNITUS PO) Take 4 capsules by mouth daily. Patient is using this medication tinnitus.    [provider]  Boris Lown Oil 500 MG CAPS Take 500 mg by mouth.    [provider]  losartan (COZAAR) 25 MG tablet Take 25 mg by mouth daily.    [provider]  metoprolol succinate (TOPROL-XL) 50 MG 24 hr tablet Take 50 mg by mouth daily. Take with or immediately following a meal.    [provider]  nitroGLYCERIN (NITROSTAT) 0.4 MG SL tablet Place 0.4 mg under the tongue every 5 (five) minutes as needed for chest pain.    [provider]  omeprazole (PRILOSEC) 40 MG capsule Take 40 mg by mouth daily. Patient is using this medication for acid reflux.    [provider]  pravastatin (PRAVACHOL) 40 MG tablet Take 40 mg by mouth daily.    [provider]    Allergies    Penicillins and Amoxil [amoxicillin]  Review of Systems   Review of Systems  Constitutional:  Negative for chills and fever.  HENT:  Negative for  ear pain and sore throat.   Eyes:  Negative for pain and visual disturbance.  Respiratory:  Negative for cough and shortness of breath.   Cardiovascular:  Negative for chest pain and palpitations.  Gastrointestinal:  Negative for abdominal pain, nausea and vomiting.  Genitourinary:  Negative for dysuria and hematuria.  Musculoskeletal:  Negative for arthralgias and back pain.  Skin:  Negative for color change and rash.  Neurological:  Positive for weakness. Negative for dizziness, tremors, seizures, syncope, facial asymmetry, speech difficulty, light-headedness, numbness and headaches.  All other systems reviewed and are negative.  Physical Exam Updated Vital Signs Grace IsaacPatricia A Ohlinger   Physical Exam Vitals and nursing note  reviewed.  Constitutional:      General: She is not in acute distress.    Appearance: She is well-developed. She is not ill-appearing.  HENT:     Head: Normocephalic and atraumatic.     Nose: Nose normal.     Mouth/Throat:     Mouth: Mucous membranes are moist.  Eyes:     Extraocular Movements: Extraocular movements intact.     Conjunctiva/sclera: Conjunctivae normal.     Pupils: Pupils are equal, round, and reactive to light.  Cardiovascular:     Rate and Rhythm: Normal rate and regular rhythm.     Pulses: Normal pulses.     Heart sounds: Normal heart sounds. No murmur heard. Pulmonary:     Effort: Pulmonary effort is normal. No respiratory distress.     Breath sounds: Normal breath sounds.  Abdominal:     Palpations: Abdomen is soft.     Tenderness: There is no abdominal tenderness.  Musculoskeletal:     Cervical back: Normal range of motion and neck supple.  Skin:    General: Skin is warm and dry.  Neurological:     General: No focal deficit present.     Mental Status: She is alert and oriented to person, place, and time.     Cranial Nerves: No cranial nerve deficit.     Sensory: No sensory deficit.     Motor: No weakness.     Coordination: Coordination normal.     Comments: 5+ out of 5 strength, normal sensation, no drift, normal finger-nose-finger, normal speech    ED Results / Procedures / Treatments   Labs (all labs ordered are listed, but only abnormal results are displayed) Labs Reviewed  BASIC METABOLIC PANEL - Abnormal; Notable for the following components:      Result Value   Sodium 133 (*)    Glucose, Bld 140 (*)    BUN 26 (*)    Creatinine, Ser 1.39 (*)    GFR, Estimated 38 (*)    All other components within normal limits  CBC WITH DIFFERENTIAL/PLATELET  LIPID PANEL    EKG EKG Interpretation  Date/Time:  Thursday May 25 2021 20:05:00 EDT Ventricular Rate:  69 PR Interval:  175 QRS Duration: 106 QT Interval:  449 QTC Calculation: 481 R  Axis:   -31 Text Interpretation: Sinus rhythm Probable left atrial enlargement Abnormal R-wave progression, early transition Confirmed by Virgina Norfolkuratolo, Sheliah Fiorillo ([id number]) on 05/25/2021 8:12:38 PM  Radiology CT Angio Head W or Wo Contrast  Result Date: 05/25/2021 CLINICAL DATA:  TIA symptoms EXAM: CT ANGIOGRAPHY HEAD AND NECK TECHNIQUE: Multidetector CT imaging of the head and neck was performed using the standard protocol during bolus administration of intravenous contrast. Multiplanar CT image reconstructions and MIPs were obtained to evaluate the vascular anatomy. Carotid stenosis measurements (when applicable) are obtained  utilizing NASCET criteria, using the distal internal carotid diameter as the denominator. CONTRAST:  31mL OMNIPAQUE IOHEXOL 350 MG/ML SOLN COMPARISON:  None. FINDINGS: CTA NECK FINDINGS SKELETON: There is no bony spinal canal stenosis. No lytic or blastic lesion. OTHER NECK: Normal pharynx, larynx and major salivary glands. No cervical lymphadenopathy. Unremarkable thyroid gland. UPPER CHEST: No pneumothorax or pleural effusion. No nodules or masses. AORTIC ARCH: There is calcific atherosclerosis of the aortic arch. There is no aneurysm, dissection or hemodynamically significant stenosis of the visualized portion of the aorta. Conventional 3 vessel aortic branching pattern. The visualized proximal subclavian arteries are widely patent. RIGHT CAROTID SYSTEM: Normal without aneurysm, dissection or stenosis. LEFT CAROTID SYSTEM: Normal without aneurysm, dissection or stenosis. VERTEBRAL ARTERIES: Codominant configuration. Both origins are clearly patent. There is no dissection, occlusion or flow-limiting stenosis to the skull base (V1-V3 segments). CTA HEAD FINDINGS POSTERIOR CIRCULATION: --Vertebral arteries: Normal V4 segments. --Inferior cerebellar arteries: Normal. --Basilar artery: Normal. --Superior cerebellar arteries: Normal. --Posterior cerebral arteries (PCA): Normal. ANTERIOR CIRCULATION:  --Intracranial internal carotid arteries: Normal. --Anterior cerebral arteries (ACA): Normal. Both A1 segments are present. Patent anterior communicating artery (a-comm). --Middle cerebral arteries (MCA): Normal. VENOUS SINUSES: As permitted by contrast timing, patent. ANATOMIC VARIANTS: None Review of the MIP images confirms the above findings. IMPRESSION: 1. No emergent large vessel occlusion or high-grade stenosis of the intracranial or cervical arteries. Aortic Atherosclerosis (ICD10-I70.0). Electronically Signed   By: Deatra Robinson M.D.   On: 05/25/2021 21:21   CT HEAD WO CONTRAST ( )  Result Date: 05/25/2021 CLINICAL DATA:  TIA symptoms EXAM: CT HEAD WITHOUT CONTRAST TECHNIQUE: Contiguous axial images were obtained from the base of the skull through the vertex without intravenous contrast. COMPARISON:  12/19/2020 CT and MRI FINDINGS: Brain: Increased hypodensity in the right parietooccipital lobe, consistent with expected evolution of the infarct seen on the patient's 12/19/2020 exam. Additional hypodensity in the lateral left frontal lobe, consistent with remote infarct. No acute infarct, cerebral edema, mass, mass effect, or midline shift. No extra-axial collection. No hydrocephalus. Vascular: No hyperdense vessel. Atherosclerotic calcifications in the intracranial carotid and vertebral arteries. Skull: Normal. Negative for fracture or focal lesion. Sinuses/Orbits: No acute finding. Other: None. IMPRESSION: No acute intracranial process. Electronically Signed   By: Wiliam Ke M.D.   On: 05/25/2021 20:24   CT Angio Neck W and/or Wo Contrast  Result Date: 05/25/2021 CLINICAL DATA:  TIA symptoms EXAM: CT ANGIOGRAPHY HEAD AND NECK TECHNIQUE: Multidetector CT imaging of the head and neck was performed using the standard protocol during bolus administration of intravenous contrast. Multiplanar CT image reconstructions and MIPs were obtained to evaluate the vascular anatomy. Carotid stenosis  measurements (when applicable) are obtained utilizing NASCET criteria, using the distal internal carotid diameter as the denominator. CONTRAST:  25mL OMNIPAQUE IOHEXOL 350 MG/ML SOLN COMPARISON:  None. FINDINGS: CTA NECK FINDINGS SKELETON: There is no bony spinal canal stenosis. No lytic or blastic lesion. OTHER NECK: Normal pharynx, larynx and major salivary glands. No cervical lymphadenopathy. Unremarkable thyroid gland. UPPER CHEST: No pneumothorax or pleural effusion. No nodules or masses. AORTIC ARCH: There is calcific atherosclerosis of the aortic arch. There is no aneurysm, dissection or hemodynamically significant stenosis of the visualized portion of the aorta. Conventional 3 vessel aortic branching pattern. The visualized proximal subclavian arteries are widely patent. RIGHT CAROTID SYSTEM: Normal without aneurysm, dissection or stenosis. LEFT CAROTID SYSTEM: Normal without aneurysm, dissection or stenosis. VERTEBRAL ARTERIES: Codominant configuration. Both origins are clearly patent. There is no dissection, occlusion or  flow-limiting stenosis to the skull base (V1-V3 segments). CTA HEAD FINDINGS POSTERIOR CIRCULATION: --Vertebral arteries: Normal V4 segments. --Inferior cerebellar arteries: Normal. --Basilar artery: Normal. --Superior cerebellar arteries: Normal. --Posterior cerebral arteries (PCA): Normal. ANTERIOR CIRCULATION: --Intracranial internal carotid arteries: Normal. --Anterior cerebral arteries (ACA): Normal. Both A1 segments are present. Patent anterior communicating artery (a-comm). --Middle cerebral arteries (MCA): Normal. VENOUS SINUSES: As permitted by contrast timing, patent. ANATOMIC VARIANTS: None Review of the MIP images confirms the above findings. IMPRESSION: 1. No emergent large vessel occlusion or high-grade stenosis of the intracranial or cervical arteries. Aortic Atherosclerosis (ICD10-I70.0). Electronically Signed   By: Deatra Robinson M.D.   On: 05/25/2021 21:21     Procedures Procedures   Medications Ordered in ED Medications  iohexol (OMNIPAQUE) 350 MG/ML injection 60 mL (60 mLs Intravenous Contrast Given 05/25/21 2056)    ED Course  I have reviewed the triage vital signs and the nursing notes.  Pertinent labs & imaging results that were available during my care of the patient were reviewed by me and considered in my medical decision making (see chart for details).    MDM Rules/Calculators/A&P                           PHILOMENE HAFF is here after an episode of left arm weakness.  Symptoms lasted for about 10 minutes and now resolved.  History of stroke on Eliquis.  No neck pain, no back pain, no headaches.  Overall she is neurologically intact on exam.  She is on Eliquis.  She is following with neurology outpatient.  Lab work and CT scan today unremarkable.  Talked with our neurologist on-call due to concern for TIA.  Given that she is fairly maximally treated for stroke prevention we will get a CTA of her head and neck.  If this is unremarkable without any evidence of high-grade stenosis okay for close neurology follow-up outpatient.  CTA showed no high-grade stenosis.  Overall unremarkable.  Patient neurologically intact throughout my care.  This could have been a mild TIA but she is already on maximal therapy.  She was educated about this.  She is comfortable with outpatient follow-up with neurology.  Discharged in good condition.  Understands return precautions.  This chart was dictated using voice recognition software.  Despite best efforts to proofread,  errors can occur which can change the documentation meaning.   Final Clinical Impression(s) / ED Diagnoses Final diagnoses:  Arm weakness    Rx / DC Orders ED Discharge Orders     None        Virgina Norfolk, DO 05/25/21 2140    Virgina Norfolk, DO 05/25/21 2140

## 2021-05-25 NOTE — ED Triage Notes (Addendum)
1830 tonight she was watching TV and her left arm got heavy. It lasted 15 minutes and went away. States this happens to her from time to time.

## 2021-05-25 NOTE — ED Notes (Signed)
Patient transported to CT 

## 2021-05-25 NOTE — Discharge Instructions (Addendum)
Follow-up with your neurologist.  Continue your current medications.

## 2022-02-24 IMAGING — CT CT ANGIO NECK
1 of 8 series · 6 of 33 positions shown · IV contrast (Omnipaque)
Comparison: None.

CLINICAL DATA: TIA symptoms

EXAM:
CT ANGIOGRAPHY HEAD AND NECK
TECHNIQUE: Multidetector CT imaging of the head and neck was performed using
the standard protocol during bolus administration of intravenous
contrast. Multiplanar CT image reconstructions and MIPs were
obtained to evaluate the vascular anatomy. Carotid stenosis
measurements (when applicable) are obtained utilizing NASCET
criteria, using the distal internal carotid diameter as the
denominator.
CONTRAST:  60mL OMNIPAQUE IOHEXOL 350 MG/ML SOLN

[Series 7: axial thin · axial · 0.63mm/px · z∈[+796,+1076]mm · 6 of 394 slices shown]
[im 57/394  soft-tissue]
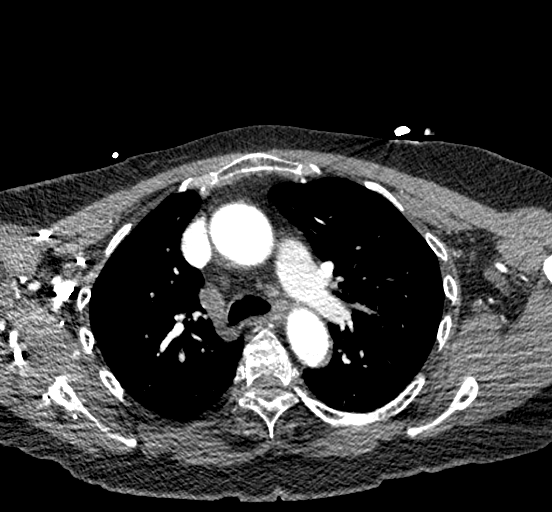
[im 113/394  bone]
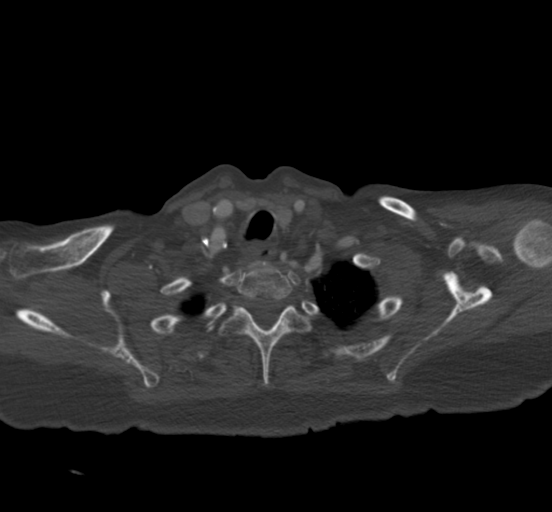
[im 169/394  soft-tissue]
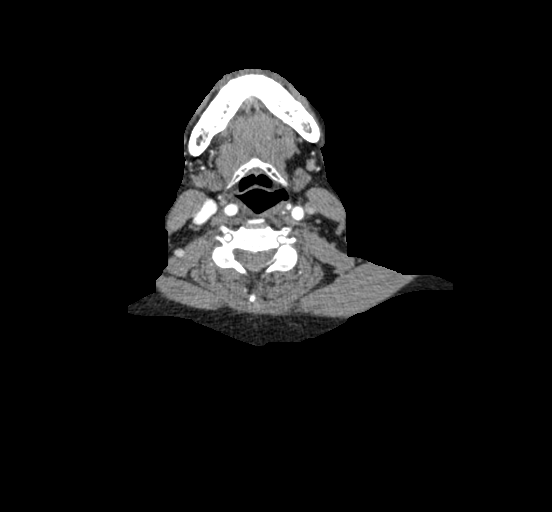
[im 225/394  bone]
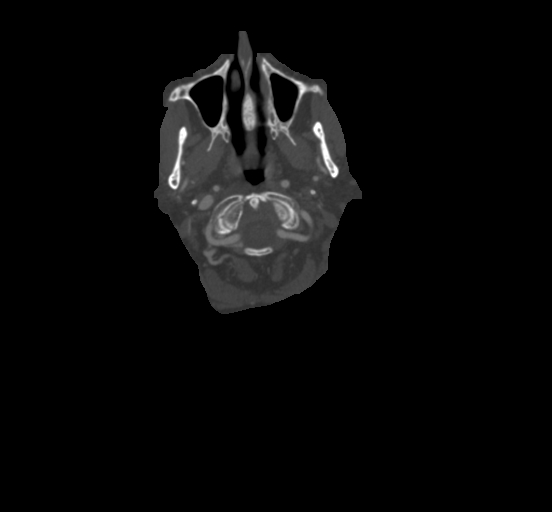
[im 281/394  soft-tissue]
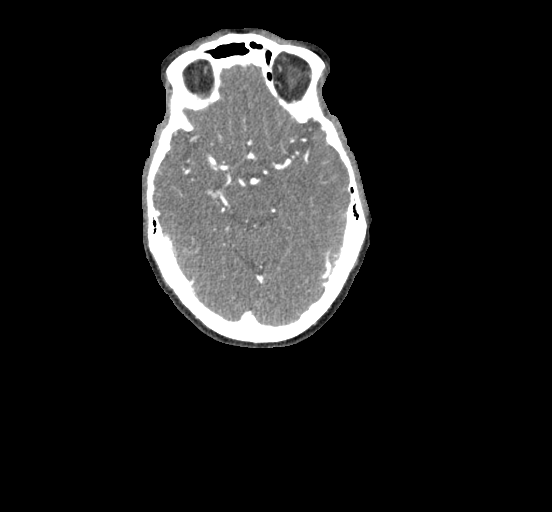
[im 337/394  bone]
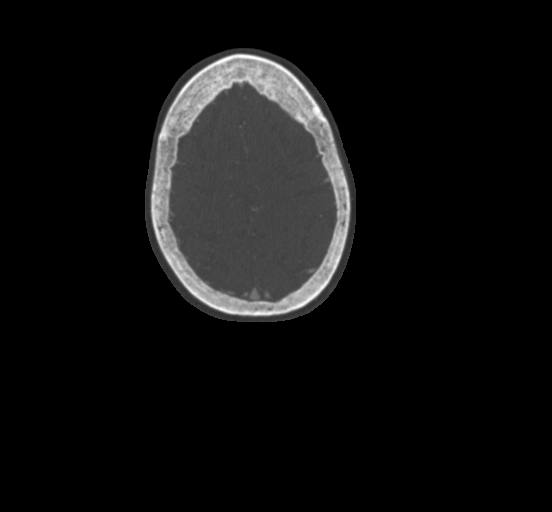

[6 of 33 positions shown; findings below may reference images not displayed]

FINDINGS: CTA NECK FINDINGS

SKELETON: There is no bony spinal canal stenosis. No lytic or
blastic lesion.

OTHER NECK: Normal pharynx, larynx and major salivary glands. No
cervical lymphadenopathy. Unremarkable thyroid gland.

UPPER CHEST: No pneumothorax or pleural effusion. No nodules or
masses.

AORTIC ARCH:

There is calcific atherosclerosis of the aortic arch. There is no
aneurysm, dissection or hemodynamically significant stenosis of the
visualized portion of the aorta. Conventional 3 vessel aortic
branching pattern. The visualized proximal subclavian arteries are
widely patent.

RIGHT CAROTID SYSTEM: Normal without aneurysm, dissection or
stenosis.

LEFT CAROTID SYSTEM: Normal without aneurysm, dissection or
stenosis.

VERTEBRAL ARTERIES: Codominant configuration. Both origins are
clearly patent. There is no dissection, occlusion or flow-limiting
stenosis to the skull base (V1-V3 segments).

CTA HEAD FINDINGS

POSTERIOR CIRCULATION:

--Vertebral arteries: Normal V4 segments.

--Inferior cerebellar arteries: Normal.

--Basilar artery: Normal.

--Superior cerebellar arteries: Normal.

--Posterior cerebral arteries (PCA): Normal.

ANTERIOR CIRCULATION:

--Intracranial internal carotid arteries: Normal.

--Anterior cerebral arteries (ACA): Normal. Both A1 segments are
present. Patent anterior communicating artery (a-comm).

--Middle cerebral arteries (MCA): Normal.

VENOUS SINUSES: As permitted by contrast timing, patent.

ANATOMIC VARIANTS: None

Review of the MIP images confirms the above findings.
IMPRESSION: 1. No emergent large vessel occlusion or high-grade stenosis of the
intracranial or cervical arteries.

Aortic Atherosclerosis ([id number]).

## 2022-02-24 IMAGING — CT CT HEAD W/O CM
3 series · 15 of 47 positions shown, 18 images · non-contrast
Comparison: 12/19/2020 CT and MRI

CLINICAL DATA: TIA symptoms

EXAM:
CT HEAD WITHOUT CONTRAST
TECHNIQUE: Contiguous axial images were obtained from the base of the skull
through the vertex without intravenous contrast.

[Series 2: head wo · axial · 0.46mm/px · z∈[+1086,+1216]mm · 9 of 32 slices shown, 12 images]
[im 3/32  brain]
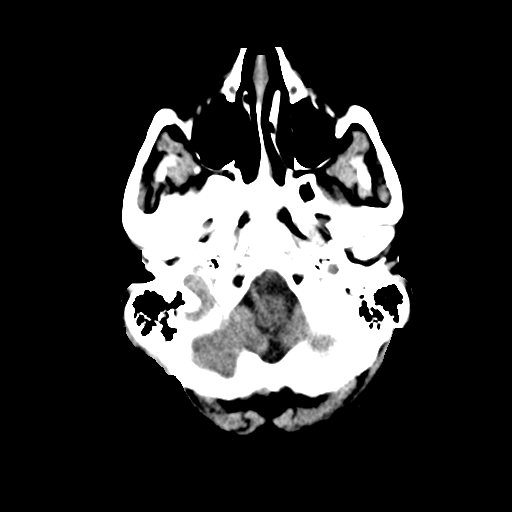
[im 3/32  bone]
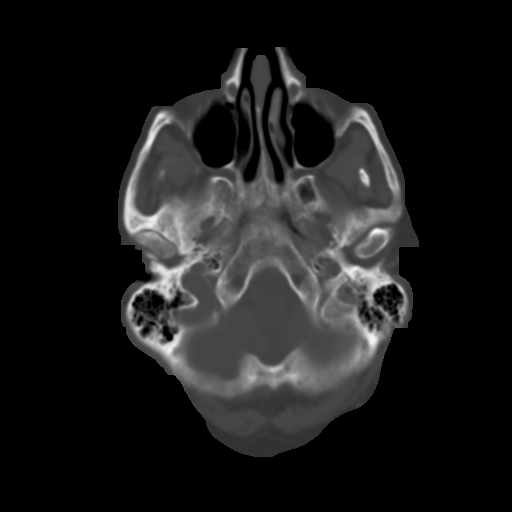
[im 6/32  brain]
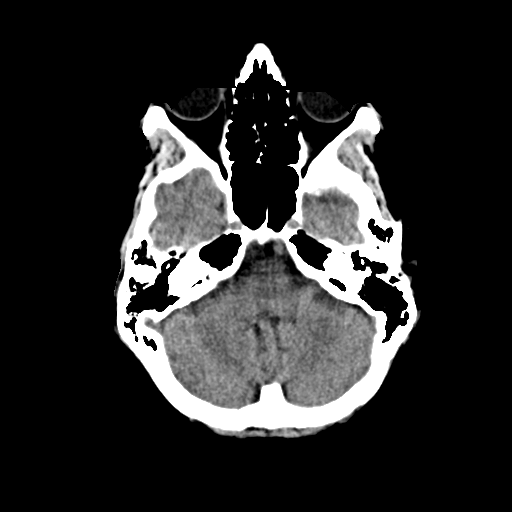
[im 9/32  brain]
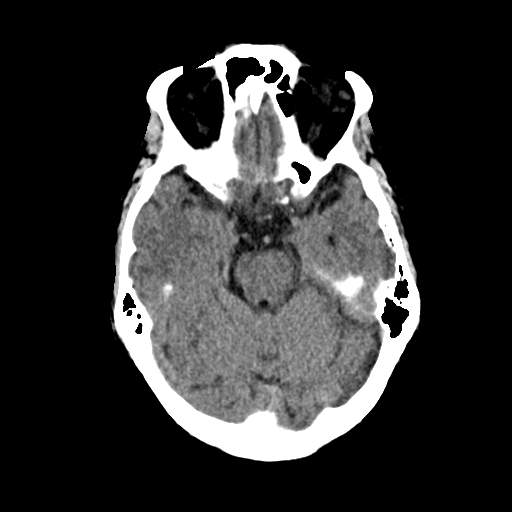
[im 12/32  brain]
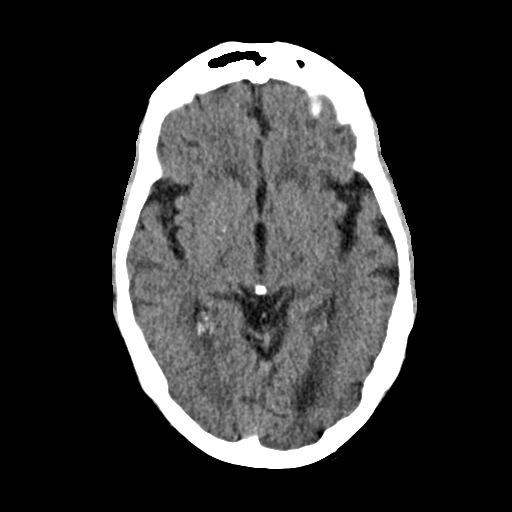
[im 17/32  brain]
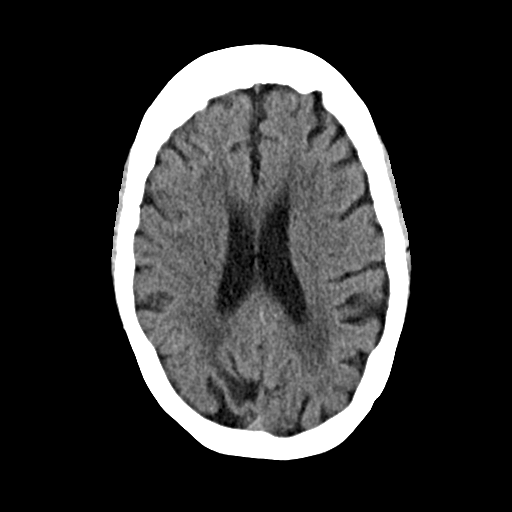
[im 17/32  bone]
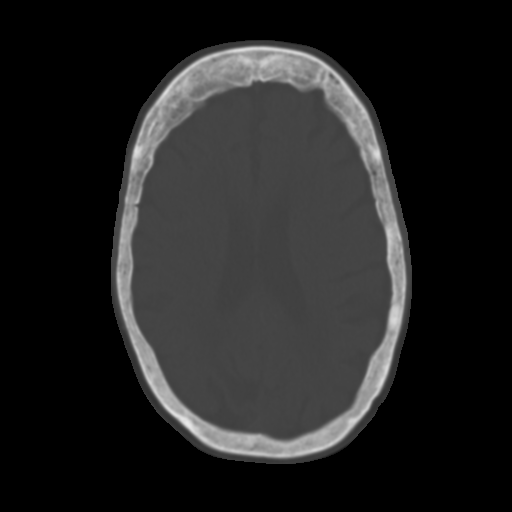
[im 20/32  brain]
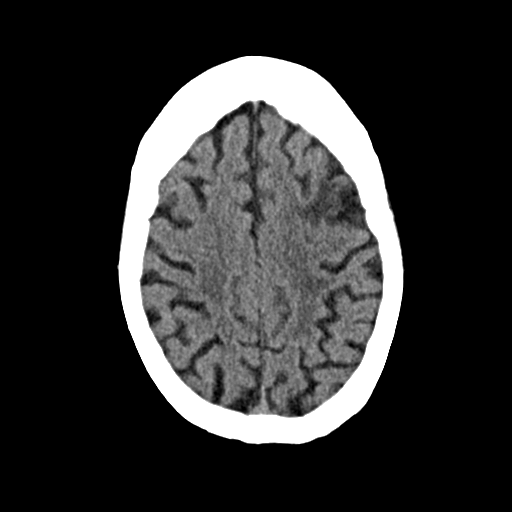
[im 23/32  brain]
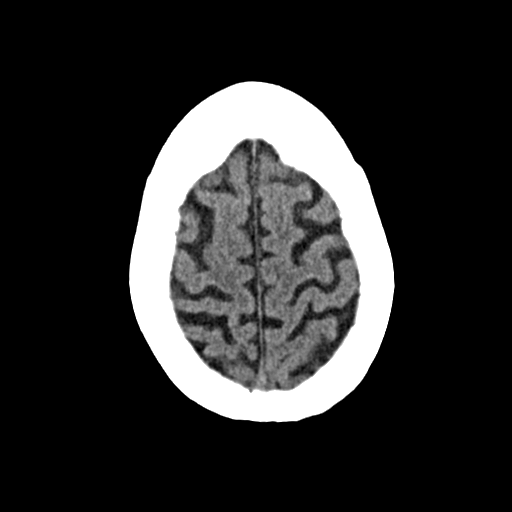
[im 26/32  brain]
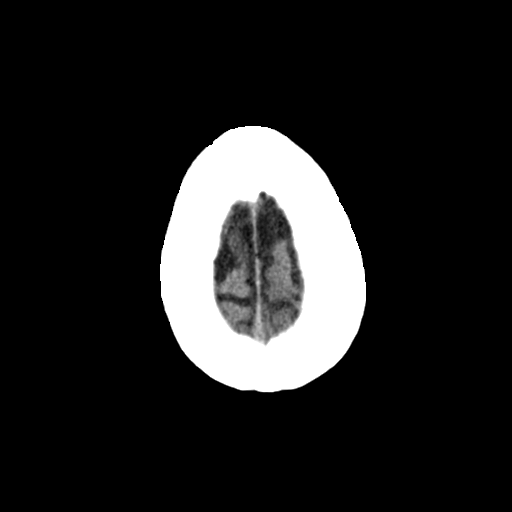
[im 29/32  brain]
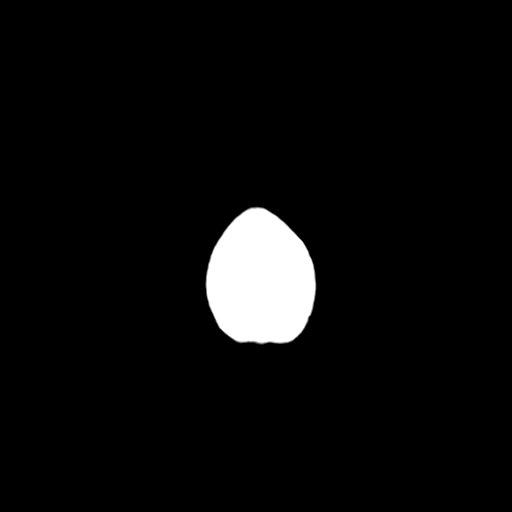
[im 29/32  bone]
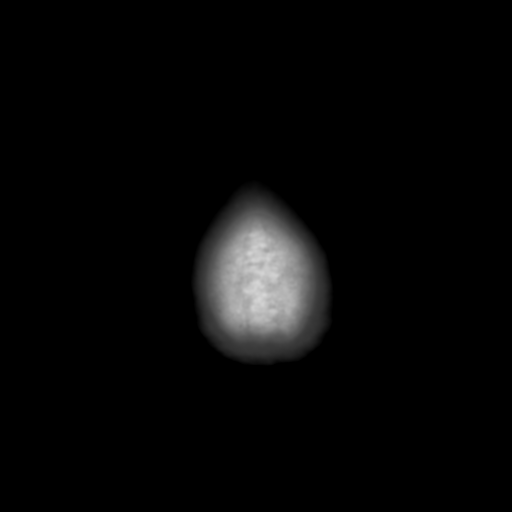

[Series 4: coronal soft · coronal · 0.31mm/px · 3 of 72 slices shown]
[im 24/72  brain]
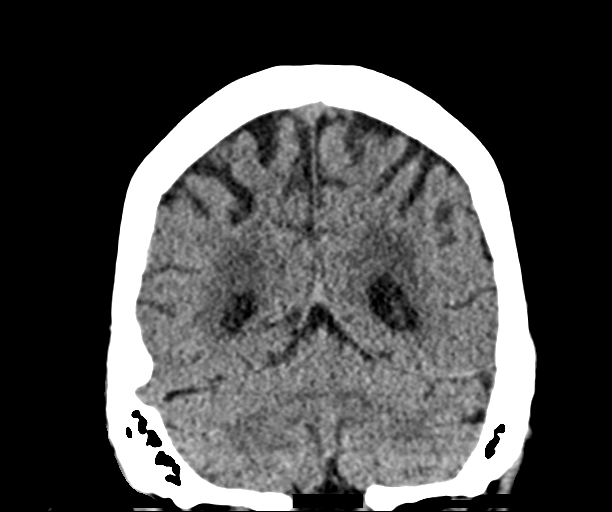
[im 32/72  brain]
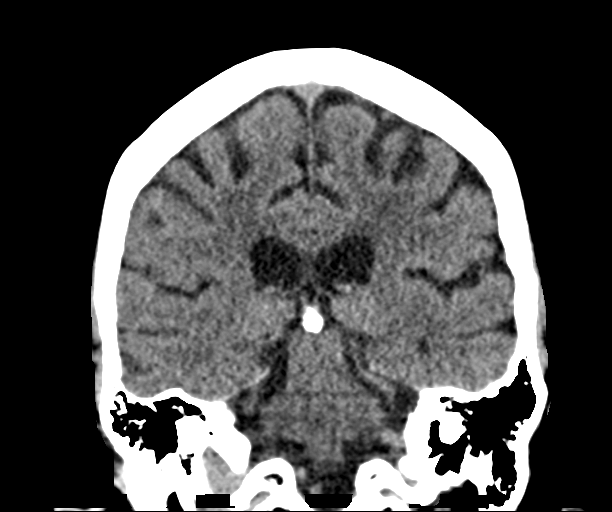
[im 40/72  brain]
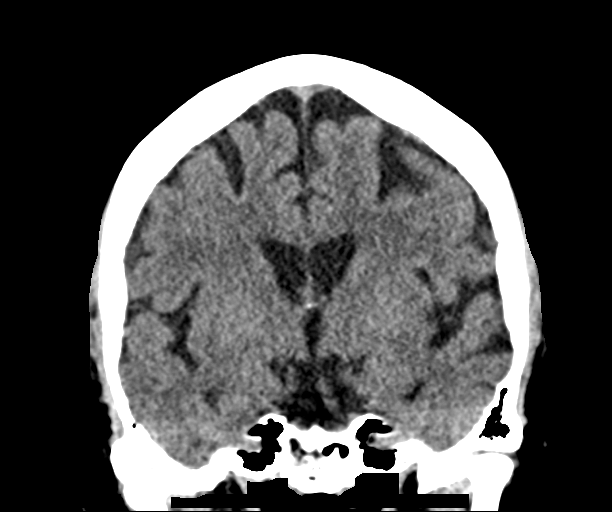

[Series 5: sag soft · sagittal · 0.31mm/px · 3 of 67 slices shown]
[im 23/67  brain]
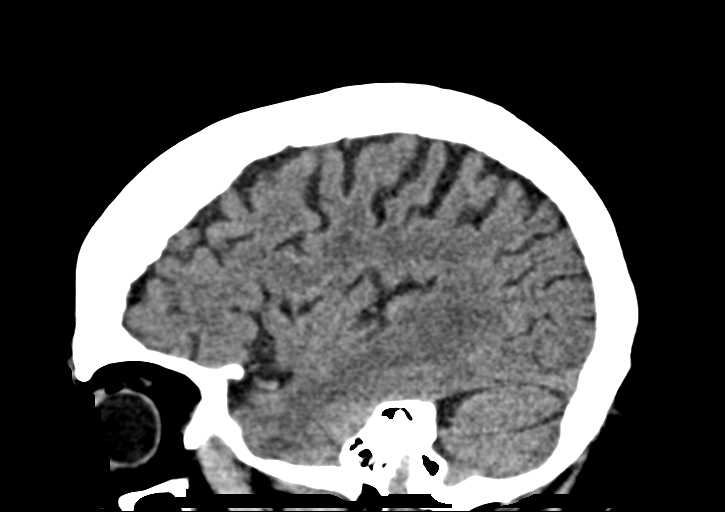
[im 34/67  brain]
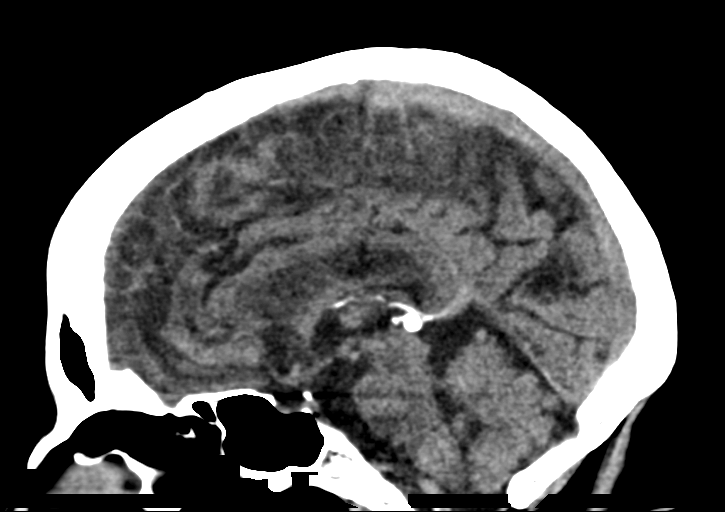
[im 45/67  brain]
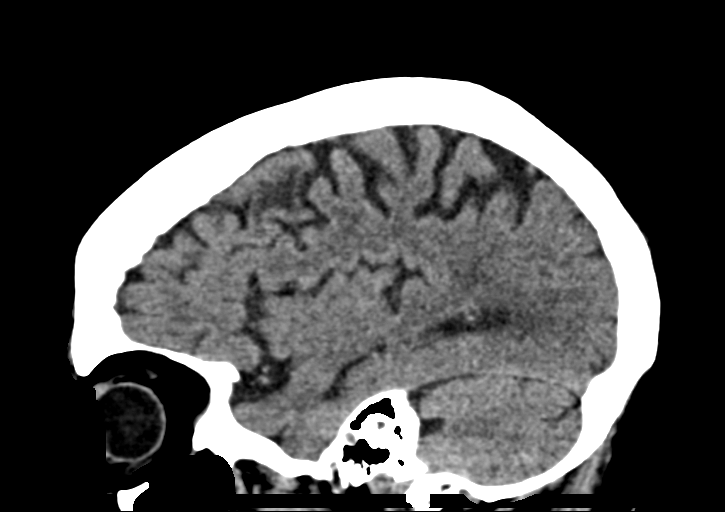

[15 of 47 positions shown; findings below may reference images not displayed]

FINDINGS: Brain: Increased hypodensity in the right parietooccipital lobe,
consistent with expected evolution of the infarct seen on the
patient's 12/19/2020 exam. Additional hypodensity in the lateral
left frontal lobe, consistent with remote infarct. No acute infarct,
cerebral edema, mass, mass effect, or midline shift. No extra-axial
collection. No hydrocephalus.

Vascular: No hyperdense vessel. Atherosclerotic calcifications in
the intracranial carotid and vertebral arteries.

Skull: Normal. Negative for fracture or focal lesion.

Sinuses/Orbits: No acute finding.

Other: None.
IMPRESSION: No acute intracranial process.

## 2023-06-20 ENCOUNTER — Ambulatory Visit: Payer: Medicare Other | Admitting: Internal Medicine

## 2023-06-20 ENCOUNTER — Encounter: Payer: Self-pay | Admitting: Internal Medicine

## 2023-06-20 VITALS — BP 124/80 | HR 77 | Temp 98.0°F | Resp 18 | Ht 64.0 in | Wt 185.5 lb

## 2023-06-20 DIAGNOSIS — M21372 Foot drop, left foot: Secondary | ICD-10-CM | POA: Diagnosis not present

## 2023-06-20 DIAGNOSIS — Z6831 Body mass index (BMI) 31.0-31.9, adult: Secondary | ICD-10-CM

## 2023-06-20 DIAGNOSIS — M5410 Radiculopathy, site unspecified: Secondary | ICD-10-CM

## 2023-06-20 NOTE — Progress Notes (Signed)
Acute Office Visit  Subjective:     Patient ID: Patricia Case, female    DOB: 07-22-1936, 87 y.o.   MRN: [id number]  Chief Complaint  Patient presents with   Foot Pain    Left foot pain    HPI Patient is in today for left foot weakness.  Patient is 87 years old female with history of chronic kidney disease and left-sided back pain with radiculopathy.  She says that she tripped 4 days ago and almost fell forward.  Since then she noticed that she cannot lift her left foot up.  She feels that this is slowly getting worse.  She denies any pain.  She said that her back pain has not flared up either.  No numbness or weakness.  No loss of urine or bowel.  She walks with a walker.  She called her primary care doctor but they could not see her so she came to our office.  She denies having any weakness in her left leg or left arm.  Review of Systems  Constitutional: Negative.   Musculoskeletal:  Positive for back pain.  Neurological:        Left foot unable to lift up        Objective:    BP 124/80 (BP Location: Left Arm, Patient Position: Sitting, Cuff Size: Normal)   Pulse 77   Temp 98 F (36.7 C)   Resp 18   Ht 5\' 4"  (1.626 m)   Wt 185 lb 8 oz (84.1 kg)   SpO2 97%   BMI 31.84 kg/m    Physical Exam Constitutional:      Appearance: Normal appearance.  Neurological:     Mental Status: She is alert and oriented to person, place, and time.     Coordination: Coordination abnormal.     Comments: She has left drop foot, her reflexes and sensations are intact.      No results found for any visits on 06/20/23.      Assessment & Plan:   Problem List Items Addressed This Visit       Nervous and Auditory   Back pain with left-sided radiculopathy    She says that her back pain has not flared up.        Other   Foot drop, left foot - Primary    She has left drop foot for 5 days.  Her back pain has not flared up.  He does not have any other signs of TIA or any other  weakness.  I have suggested to start physical therapy and recommended to lift her leg up while using a rollator so she will not fall.  She may need to see a neurologist if condition continued to get worse.  She will see her primary care doctor for further evaluation and treatment.       No orders of the defined types were placed in this encounter.   No follow-ups on file.  Eloisa Northern, MD

## 2023-06-20 NOTE — Assessment & Plan Note (Signed)
She has left drop foot for 5 days.  Her back pain has not flared up.  He does not have any other signs of TIA or any other weakness.  I have suggested to start physical therapy and recommended to lift her leg up while using a rollator so she will not fall.  She may need to see a neurologist if condition continued to get worse.  She will see her primary care doctor for further evaluation and treatment.

## 2023-06-20 NOTE — Assessment & Plan Note (Signed)
She says that her back pain has not flared up.
